# Patient Record
Sex: Female | Born: 1960 | Race: Black or African American | Hispanic: No
Health system: Southern US, Community
[De-identification: ages and names within clinical notes are randomized; demographics above are authoritative.]

## PROBLEM LIST (undated history)

## (undated) DIAGNOSIS — I10 Essential (primary) hypertension: Secondary | ICD-10-CM

## (undated) DIAGNOSIS — E785 Hyperlipidemia, unspecified: Secondary | ICD-10-CM

## (undated) DIAGNOSIS — M109 Gout, unspecified: Secondary | ICD-10-CM

## (undated) DIAGNOSIS — F419 Anxiety disorder, unspecified: Secondary | ICD-10-CM

## (undated) DIAGNOSIS — R7303 Prediabetes: Secondary | ICD-10-CM

## (undated) DIAGNOSIS — M1990 Unspecified osteoarthritis, unspecified site: Secondary | ICD-10-CM

## (undated) HISTORY — PX: ABDOMINAL HYSTERECTOMY: SHX81

## (undated) HISTORY — DX: Gout, unspecified: M10.9

## (undated) HISTORY — DX: Unspecified osteoarthritis, unspecified site: M19.90

## (undated) HISTORY — DX: Anxiety disorder, unspecified: F41.9

## (undated) HISTORY — DX: Prediabetes: R73.03

---

## 2012-10-23 ENCOUNTER — Telehealth: Payer: Self-pay

## 2012-10-23 NOTE — Telephone Encounter (Signed)
Pt was referred by Ferdie Ping, PA at [facility] for screening colonoscopy. LMOM to call.

## 2012-11-04 ENCOUNTER — Telehealth: Payer: Self-pay

## 2012-11-04 ENCOUNTER — Other Ambulatory Visit: Payer: Self-pay

## 2012-11-04 DIAGNOSIS — Z1211 Encounter for screening for malignant neoplasm of colon: Secondary | ICD-10-CM

## 2012-11-04 NOTE — Telephone Encounter (Signed)
Gastroenterology Pre-Procedure Form   Request Date: 11/04/2012    Requesting Physician: Ferdie Ping, PA      PATIENT INFORMATION:  Kristin Waters is a 52 y.o., female (DOB=[birth year]).  PROCEDURE: Procedure(s) requested: colonoscopy Procedure Reason: screening for colon cancer  PATIENT REVIEW QUESTIONS: The patient reports the following:   1. Diabetes Melitis: no 2. Joint replacements in the past 12 months: no 3. Major health problems in the past 3 months: no 4. Has an artificial valve or MVP:no 5. Has been advised in past to take antibiotics in advance of a procedure like teeth cleaning: no}    MEDICATIONS & ALLERGIES:    Patient reports the following regarding taking any blood thinners:   Plavix? no Aspirin?no Coumadin?  no  Patient confirms/reports the following medications:  Current Outpatient Prescriptions  Medication Sig Dispense Refill  . amLODipine (NORVASC) 5 MG tablet Take 5 mg by mouth daily.      . cloNIDine (CATAPRES) 0.1 MG tablet Take 0.1 mg by mouth at bedtime.      . cyclobenzaprine (FLEXERIL) 5 MG tablet Take 5 mg by mouth 3 (three) times daily as needed for muscle spasms. Takes only as needed      . hydrochlorothiazide (HYDRODIURIL) 25 MG tablet Take 25 mg by mouth daily.      Marland Kitchen ibuprofen (ADVIL,MOTRIN) 200 MG tablet Take 200 mg by mouth every 6 (six) hours as needed for pain. Pt said she takes 300 mg once every other day      . potassium chloride (K-DUR) 10 MEQ tablet Take 10 mEq by mouth 2 (two) times daily.      . pravastatin (PRAVACHOL) 40 MG tablet Take 40 mg by mouth daily.       No current facility-administered medications for this visit.    Patient confirms/reports the following allergies:  No Known Allergies  Patient is appropriate to schedule for requested procedure(s): yes  AUTHORIZATION INFORMATION Primary Insurance:   ID #:  Group #:  Pre-Cert / Auth required Pre-Cert / Auth #:   Secondary Insurance:   ID #:   Group #:  Pre-Cert /  Auth required Pre-Cert / Auth #:  No orders of the defined types were placed in this encounter.    SCHEDULE INFORMATION: Procedure has been scheduled as follows:  Date: 11/27/2012 Time: 9:45 AM  Location: [facility] Short Stay  This Gastroenterology Pre-Precedure Form is being routed to the following provider(s) for review: Jonette Eva, MD

## 2012-11-04 NOTE — Telephone Encounter (Signed)
See separate triage.  

## 2012-11-06 NOTE — Telephone Encounter (Signed)
MOVI PREP SPLIT DOSING, REGULAR BREAKFAST. CLEAR LIQUIDS AFTER 9 AM.   DRINK WATER TO KEEP URINE LIGHT YELLOW.

## 2012-11-09 ENCOUNTER — Encounter (HOSPITAL_COMMUNITY): Payer: Self-pay | Admitting: Pharmacy Technician

## 2012-11-09 MED ORDER — PEG-KCL-NACL-NASULF-NA ASC-C 100 G PO SOLR: 1.0000 | ORAL | Status: DC

## 2012-11-09 NOTE — Telephone Encounter (Signed)
Rx sent to the pharmacy and instructions mailed to pt.  

## 2012-11-26 ENCOUNTER — Telehealth: Payer: Self-pay

## 2012-11-26 NOTE — Telephone Encounter (Signed)
Pt left Vm. When I called she said someone had already talked to her about her instructions and she understands it all now.

## 2012-11-26 NOTE — Telephone Encounter (Signed)
REVIEWED.  

## 2012-11-26 NOTE — Telephone Encounter (Signed)
Pt again called about prep. She had Movie prep and instructions ordered. She got the Lindsay. I called and talked to Greig Castilla, Teacher, early years/pre , at [employer]. He said someone inadvertently gave her the trilyte instead of the movie prep. I told him they needed to correct he. He said, oh, by the way, the Movie Prep will cost her over $100.00 can she just keep the Trilyte prep. I asked if they did that because of the price and he said no, that was an over sight. So I told him that Hope Pigeon would be ok and I would call pt and go over instructions for the different prep. I called and explained to pt what happened. Then told her to take 2 Ducolax tablets now and start the prep. Went over the instructions and asked her to take 2 more Ducolax at 8:00 pm. She expressed understanding about the prep and she is aware NPO after midnight and give herself a fleet enema one hour before she goes to the hospital tomorrow morning.

## 2012-11-27 ENCOUNTER — Encounter (HOSPITAL_COMMUNITY)
Admission: RE | Disposition: A | Discharge status: Home / Self Care | Payer: Self-pay | Source: Ambulatory Visit | Attending: Gastroenterology

## 2012-11-27 ENCOUNTER — Ambulatory Visit (HOSPITAL_COMMUNITY)
Admission: RE | Admit: 2012-11-27 | Discharge: 2012-11-27 | Disposition: A | Discharge status: Home / Self Care | Payer: BC Managed Care – PPO | Source: Ambulatory Visit | Attending: Gastroenterology | Admitting: Gastroenterology

## 2012-11-27 ENCOUNTER — Encounter (HOSPITAL_COMMUNITY): Payer: Self-pay | Admitting: *Deleted

## 2012-11-27 DIAGNOSIS — D126 Benign neoplasm of colon, unspecified: Secondary | ICD-10-CM

## 2012-11-27 DIAGNOSIS — K648 Other hemorrhoids: Secondary | ICD-10-CM

## 2012-11-27 DIAGNOSIS — I10 Essential (primary) hypertension: Secondary | ICD-10-CM | POA: Insufficient documentation

## 2012-11-27 DIAGNOSIS — Z1211 Encounter for screening for malignant neoplasm of colon: Secondary | ICD-10-CM

## 2012-11-27 DIAGNOSIS — K5730 Diverticulosis of large intestine without perforation or abscess without bleeding: Secondary | ICD-10-CM | POA: Insufficient documentation

## 2012-11-27 HISTORY — PX: COLONOSCOPY: SHX5424

## 2012-11-27 HISTORY — DX: Hyperlipidemia, unspecified: E78.5

## 2012-11-27 HISTORY — DX: Essential (primary) hypertension: I10

## 2012-11-27 SURGERY — COLONOSCOPY
Anesthesia: Moderate Sedation

## 2012-11-27 MED ORDER — MIDAZOLAM HCL 5 MG/5ML IJ SOLN
INTRAMUSCULAR | Status: DC | PRN
  Administered 2012-11-27: 1 mg via INTRAVENOUS
  Administered 2012-11-27 (×2): 2 mg via INTRAVENOUS

## 2012-11-27 MED ORDER — MEPERIDINE HCL 100 MG/ML IJ SOLN
INTRAMUSCULAR | Status: DC | PRN
  Administered 2012-11-27 (×2): 25 mg via INTRAVENOUS

## 2012-11-27 MED ORDER — MIDAZOLAM HCL 5 MG/5ML IJ SOLN
INTRAMUSCULAR | Status: AC
  Filled 2012-11-27: qty 10

## 2012-11-27 MED ORDER — MEPERIDINE HCL 100 MG/ML IJ SOLN
INTRAMUSCULAR | Status: AC
  Filled 2012-11-27: qty 1

## 2012-11-27 MED ORDER — STERILE WATER FOR IRRIGATION IR SOLN
Status: DC | PRN
  Administered 2012-11-27: 10:00:00

## 2012-11-27 MED ORDER — SODIUM CHLORIDE 0.9 % IV SOLN
INTRAVENOUS | Status: DC
  Administered 2012-11-27: 09:00:00 via INTRAVENOUS

## 2012-11-27 NOTE — Op Note (Signed)
[facility] [address] [place], [address]   COLONOSCOPY PROCEDURE REPORT  PATIENT: Kristin, Waters  MR#: [id number] BIRTHDATE: 03/12/1961 , 52  yrs. old GENDER: Female ENDOSCOPIST: Jonette Eva, MD REFERRED XB:[id number] Merilynn Finland, PA-C PROCEDURE DATE:  11/27/2012 PROCEDURE:   Colonoscopy with cold biopsy polypectomy INDICATIONS:Average risk patient for colon cancer. MEDICATIONS: Demerol 50 mg IV and Versed 5 mg IV  DESCRIPTION OF PROCEDURE:    Physical exam was performed.  Informed consent was obtained from the patient after explaining the benefits, risks, and alternatives to procedure.  The patient was connected to monitor and placed in left lateral position. Continuous oxygen was provided by nasal cannula and IV medicine administered through an indwelling cannula.  After administration of sedation and rectal exam, the patients rectum was intubated and the EC-[id number] ([id number])  colonoscope was advanced under direct visualization to the ileum.  The scope was removed slowly by carefully examining the color, texture, anatomy, and integrity mucosa on the way out.  The patient was recovered in endoscopy and discharged home in satisfactory condition.     COLON FINDINGS: A sessile polyp measuring 3 mm in size was found in the transverse colon.  A polypectomy was performed with cold forceps.  , Mild diverticulosis was noted in the sigmoid colon.  , The colon mucosa was otherwise normal.  , Small internal hemorrhoids were found.  , and The mucosa appeared normal in the terminal ileum.  PREP QUALITY: good.   CECAL W/D TIME: 9 minutes     COMPLICATIONS: None  ENDOSCOPIC IMPRESSION: 1.   ONE POLYP REMOVED 2.   Mild diverticulosis in the sigmoid colon 3.   Small internal hemorrhoids   RECOMMENDATIONS: CONTINUE YOUR WEIGHT LOSS EFFORTS. FOLLOW A HIGH FIBER/LOW FAT DIET.  AVOID ITEMS THAT CAUSE BLOATING.  BIOPSY RESULTS SHOULD BE BACK IN 7 DAYS. Next  colonoscopy in 10 years.       _______________________________ Rosalie DoctorJonette Eva, MD 11/27/2012 4:11 PM     PATIENT NAME:  Kristin, Waters MR#: [id number]

## 2012-11-27 NOTE — H&P (Signed)
  Primary Care Physician:  Pcp Not In System Primary Gastroenterologist:  Dr. Darrick Penna  Pre-Procedure History & Physical: HPI:  Kristin Waters is a 52 y.o. female here for COLON CANCER SCREENING.  Past Medical History  Diagnosis Date  . Hypertension   . Hyperlipidemia     Past Surgical History  Procedure Laterality Date  . Abdominal hysterectomy      Prior to Admission medications   Medication Sig Start Date End Date Taking? Authorizing Provider  amLODipine (NORVASC) 5 MG tablet Take 5 mg by mouth daily.   Yes Historical Provider, MD  cloNIDine (CATAPRES) 0.1 MG tablet Take 0.1 mg by mouth at bedtime.   Yes Historical Provider, MD  cyclobenzaprine (FLEXERIL) 5 MG tablet Take 5 mg by mouth 3 (three) times daily as needed for muscle spasms. Takes only as needed   Yes Historical Provider, MD  hydrochlorothiazide (HYDRODIURIL) 25 MG tablet Take 25 mg by mouth daily.   Yes Historical Provider, MD  naproxen (NAPROSYN) 500 MG tablet Take 500 mg by mouth daily as needed (for pain).   Yes Historical Provider, MD  potassium chloride (K-DUR) 10 MEQ tablet Take 10 mEq by mouth every other day.    Yes Historical Provider, MD  pravastatin (PRAVACHOL) 40 MG tablet Take 40 mg by mouth daily.   Yes Historical Provider, MD    Allergies as of 11/04/2012  . (No Known Allergies)    History reviewed. No pertinent family history.  History   Social History  . Marital Status: Divorced    Spouse Name: N/A    Number of Children: N/A  . Years of Education: N/A   Occupational History  . Not on file.   Social History Main Topics  . Smoking status: Not on file  . Smokeless tobacco: Not on file  . Alcohol Use: Not on file  . Drug Use: Not on file  . Sexually Active: Not on file   Other Topics Concern  . Not on file   Social History Narrative  . No narrative on file    Review of Systems: See HPI, otherwise negative ROS   Physical Exam: BP 133/70  Pulse 72  Temp(Src) 97.8 F (36.6 C)  (Oral)  Resp 20  Ht 5\' 1"  (1.549 m)  Wt 180 lb (81.647 kg)  BMI 34.03 kg/m2  SpO2 98% General:   Alert,  pleasant and cooperative in NAD Head:  Normocephalic and atraumatic. Neck:  Supple; Lungs:  Clear throughout to auscultation.    Heart:  Regular rate and rhythm. Abdomen:  Soft, nontender and nondistended. Normal bowel sounds, without guarding, and without rebound.   Neurologic:  Alert and  oriented x4;  grossly normal neurologically.  Impression/Plan:     SCREENING  Plan:  1. TCS TODAY

## 2012-12-01 ENCOUNTER — Encounter (HOSPITAL_COMMUNITY): Payer: Self-pay | Admitting: Gastroenterology

## 2012-12-01 ENCOUNTER — Telehealth: Payer: Self-pay | Admitting: Gastroenterology

## 2012-12-01 NOTE — Telephone Encounter (Signed)
Pt is aware of results  Darl Pikes  NIC for 10 yr TCS

## 2012-12-01 NOTE — Telephone Encounter (Signed)
Reminder in epic °

## 2012-12-01 NOTE — Telephone Encounter (Signed)
Cc PCP 

## 2012-12-01 NOTE — Telephone Encounter (Signed)
Please call pt. She had A HYPERPLASTIC POLYP removed from her colon.    CONTINUE YOUR WEIGHT LOSS EFFORTS.   FOLLOW A HIGH FIBER/LOW FAT DIET. AVOID ITEMS THAT CAUSE BLOATING.  Next colonoscopy in 10 years.

## 2017-05-15 ENCOUNTER — Other Ambulatory Visit: Payer: Self-pay | Admitting: Family

## 2017-05-15 DIAGNOSIS — Z1239 Encounter for other screening for malignant neoplasm of breast: Secondary | ICD-10-CM

## 2017-06-30 ENCOUNTER — Ambulatory Visit
Admission: RE | Admit: 2017-06-30 | Discharge: 2017-06-30 | Disposition: A | Discharge status: Home / Self Care | Payer: BC Managed Care – PPO | Source: Ambulatory Visit | Attending: Family | Admitting: Family

## 2017-06-30 DIAGNOSIS — Z1231 Encounter for screening mammogram for malignant neoplasm of breast: Secondary | ICD-10-CM | POA: Insufficient documentation

## 2017-06-30 DIAGNOSIS — R928 Other abnormal and inconclusive findings on diagnostic imaging of breast: Secondary | ICD-10-CM | POA: Insufficient documentation

## 2017-06-30 DIAGNOSIS — Z1239 Encounter for other screening for malignant neoplasm of breast: Secondary | ICD-10-CM

## 2017-07-02 ENCOUNTER — Other Ambulatory Visit: Payer: Self-pay | Admitting: Family

## 2017-07-02 DIAGNOSIS — N6320 Unspecified lump in the left breast, unspecified quadrant: Secondary | ICD-10-CM

## 2017-07-02 DIAGNOSIS — R928 Other abnormal and inconclusive findings on diagnostic imaging of breast: Secondary | ICD-10-CM

## 2017-07-10 ENCOUNTER — Ambulatory Visit: Payer: BC Managed Care – PPO

## 2017-07-10 ENCOUNTER — Other Ambulatory Visit: Payer: BC Managed Care – PPO

## 2017-07-18 ENCOUNTER — Ambulatory Visit
Admission: RE | Admit: 2017-07-18 | Discharge: 2017-07-18 | Disposition: A | Discharge status: Home / Self Care | Payer: BC Managed Care – PPO | Source: Ambulatory Visit | Attending: Family | Admitting: Family

## 2017-07-18 DIAGNOSIS — R928 Other abnormal and inconclusive findings on diagnostic imaging of breast: Secondary | ICD-10-CM

## 2017-07-18 DIAGNOSIS — N6320 Unspecified lump in the left breast, unspecified quadrant: Secondary | ICD-10-CM

## 2017-07-18 DIAGNOSIS — N6321 Unspecified lump in the left breast, upper outer quadrant: Secondary | ICD-10-CM | POA: Diagnosis not present

## 2017-11-19 ENCOUNTER — Other Ambulatory Visit: Payer: Self-pay | Admitting: Family

## 2017-11-19 DIAGNOSIS — N6320 Unspecified lump in the left breast, unspecified quadrant: Secondary | ICD-10-CM

## 2017-12-05 ENCOUNTER — Other Ambulatory Visit: Payer: Self-pay | Admitting: Orthopedic Surgery

## 2017-12-05 DIAGNOSIS — M25551 Pain in right hip: Principal | ICD-10-CM

## 2017-12-05 DIAGNOSIS — G8929 Other chronic pain: Secondary | ICD-10-CM

## 2017-12-05 DIAGNOSIS — M25552 Pain in left hip: Principal | ICD-10-CM

## 2017-12-18 ENCOUNTER — Ambulatory Visit
Admission: RE | Admit: 2017-12-18 | Discharge: 2017-12-18 | Disposition: A | Discharge status: Home / Self Care | Payer: BC Managed Care – PPO | Source: Ambulatory Visit | Attending: Orthopedic Surgery | Admitting: Orthopedic Surgery

## 2017-12-18 DIAGNOSIS — G8929 Other chronic pain: Secondary | ICD-10-CM | POA: Diagnosis not present

## 2017-12-18 DIAGNOSIS — M160 Bilateral primary osteoarthritis of hip: Secondary | ICD-10-CM | POA: Diagnosis not present

## 2017-12-18 DIAGNOSIS — M25552 Pain in left hip: Secondary | ICD-10-CM | POA: Insufficient documentation

## 2017-12-18 DIAGNOSIS — M25551 Pain in right hip: Secondary | ICD-10-CM | POA: Insufficient documentation

## 2017-12-18 DIAGNOSIS — M769 Unspecified enthesopathy, lower limb, excluding foot: Secondary | ICD-10-CM | POA: Diagnosis not present

## 2017-12-18 DIAGNOSIS — M25452 Effusion, left hip: Secondary | ICD-10-CM | POA: Diagnosis not present

## 2018-02-11 ENCOUNTER — Ambulatory Visit
Admission: RE | Admit: 2018-02-11 | Discharge: 2018-02-11 | Disposition: A | Discharge status: Home / Self Care | Payer: BC Managed Care – PPO | Source: Ambulatory Visit | Attending: Oncology | Admitting: Oncology

## 2018-02-11 ENCOUNTER — Encounter (INDEPENDENT_AMBULATORY_CARE_PROVIDER_SITE_OTHER): Payer: Self-pay

## 2018-02-11 ENCOUNTER — Ambulatory Visit: Payer: BC Managed Care – PPO | Attending: Oncology

## 2018-02-11 VITALS — BP 133/81 | HR 77 | Temp 98.2°F | Ht 63.0 in | Wt 185.0 lb

## 2018-02-11 DIAGNOSIS — N63 Unspecified lump in unspecified breast: Secondary | ICD-10-CM

## 2018-02-11 NOTE — Progress Notes (Signed)
  Subjective:     Patient ID: Kristin Waters, female   DOB: 1961/02/18, 57 y.o.   MRN: [id number]  HPI   Review of Systems     Objective:   Physical Exam  Pulmonary/Chest: Right breast exhibits no inverted nipple, no mass, no nipple discharge, no skin change and no tenderness. Left breast exhibits no inverted nipple, no mass, no nipple discharge, no skin change and no tenderness. Breasts are asymmetrical.  Left breast larger than right;       Assessment:     57 year old patient presents for [place] clinic visit.  Patient had a Birads 3 mammogram in March 2019 recommending 6 month follow-up ultrasound, left breast mass.   Patient screened, and meets BCCCP eligibility.  Patient no longer has insurance. Patient does not have insurance, Medicare or Medicaid.  Handout given on Affordable Care Act.Instructed patient on breast self awareness using teach back methodClinical breast exam unremarkable.    Plan:     Sent for left breast ultrasound. Will follow per BCCCP guidelines.

## 2018-02-12 ENCOUNTER — Other Ambulatory Visit: Payer: Self-pay

## 2018-02-12 DIAGNOSIS — N63 Unspecified lump in unspecified breast: Secondary | ICD-10-CM

## 2018-02-12 NOTE — Progress Notes (Signed)
Radiologist recommends 6 month follow-up and annual diagnostic mammogram, and left breast ultrasound.  Patient scheduled for mammogram, and ultrasound on 08/19/18 at 10:00 in the [facility]  She is not due for BCCCP screening until 01/2019.  Mailed appointment info to patient.  Copy to [facility].

## 2018-08-19 ENCOUNTER — Other Ambulatory Visit: Payer: BC Managed Care – PPO

## 2018-08-19 ENCOUNTER — Ambulatory Visit: Payer: BC Managed Care – PPO

## 2018-10-21 ENCOUNTER — Other Ambulatory Visit: Payer: BC Managed Care – PPO

## 2019-01-18 ENCOUNTER — Ambulatory Visit
Admission: RE | Admit: 2019-01-18 | Discharge: 2019-01-18 | Disposition: A | Discharge status: Home / Self Care | Payer: BC Managed Care – PPO | Source: Ambulatory Visit | Attending: Oncology | Admitting: Oncology

## 2019-01-18 ENCOUNTER — Ambulatory Visit
Admission: RE | Admit: 2019-01-18 | Discharge: 2019-01-18 | Disposition: A | Discharge status: Home / Self Care | Payer: BC Managed Care – PPO | Source: Ambulatory Visit | Attending: Family | Admitting: Family

## 2019-01-18 DIAGNOSIS — N63 Unspecified lump in unspecified breast: Secondary | ICD-10-CM

## 2019-01-18 DIAGNOSIS — N6320 Unspecified lump in the left breast, unspecified quadrant: Secondary | ICD-10-CM

## 2019-01-21 NOTE — Progress Notes (Signed)
Patient to return in one year for BCCCP screening/mammogram for 2 year stability related to [facility] mammogram results.  Mailed appointment remnder.

## 2019-05-26 IMAGING — MG MM DIGITAL SCREENING BILAT W/ CAD
6 series · 6 of 6 positions shown · non-contrast
Comparison: None.

CLINICAL DATA: Screening.

EXAM:
DIGITAL SCREENING BILATERAL MAMMOGRAM WITH CAD

[L MLO]
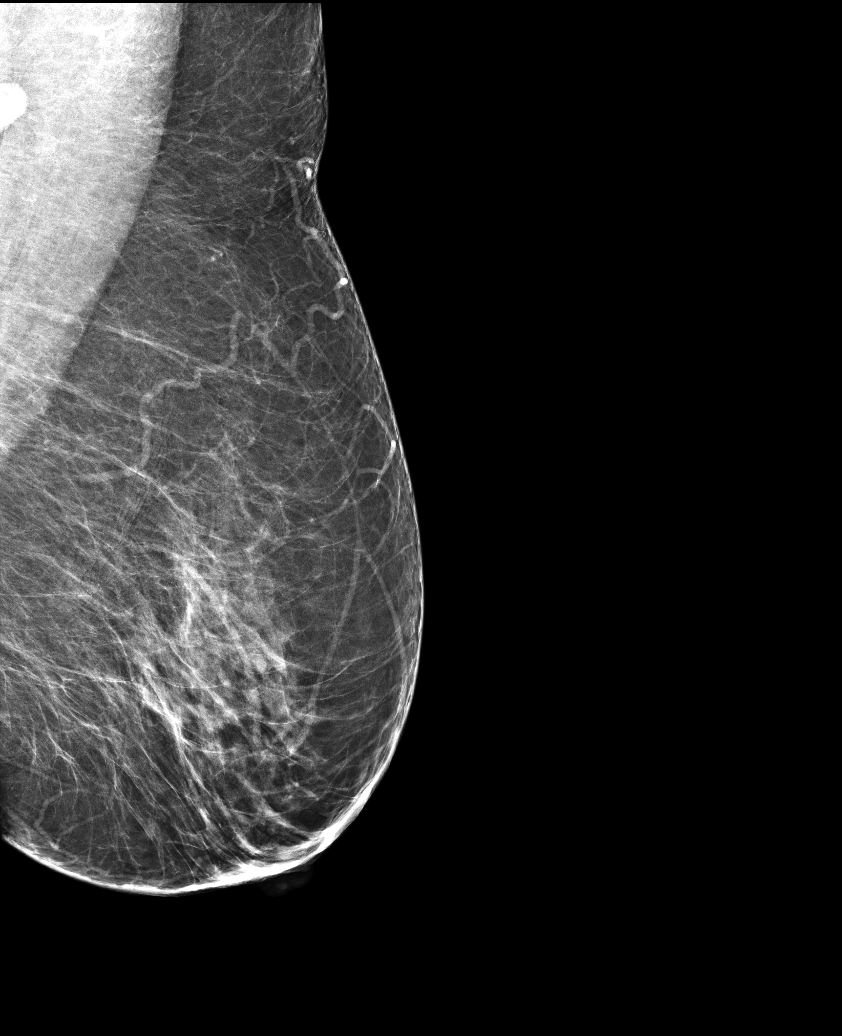

[L CC (1 of 2)]
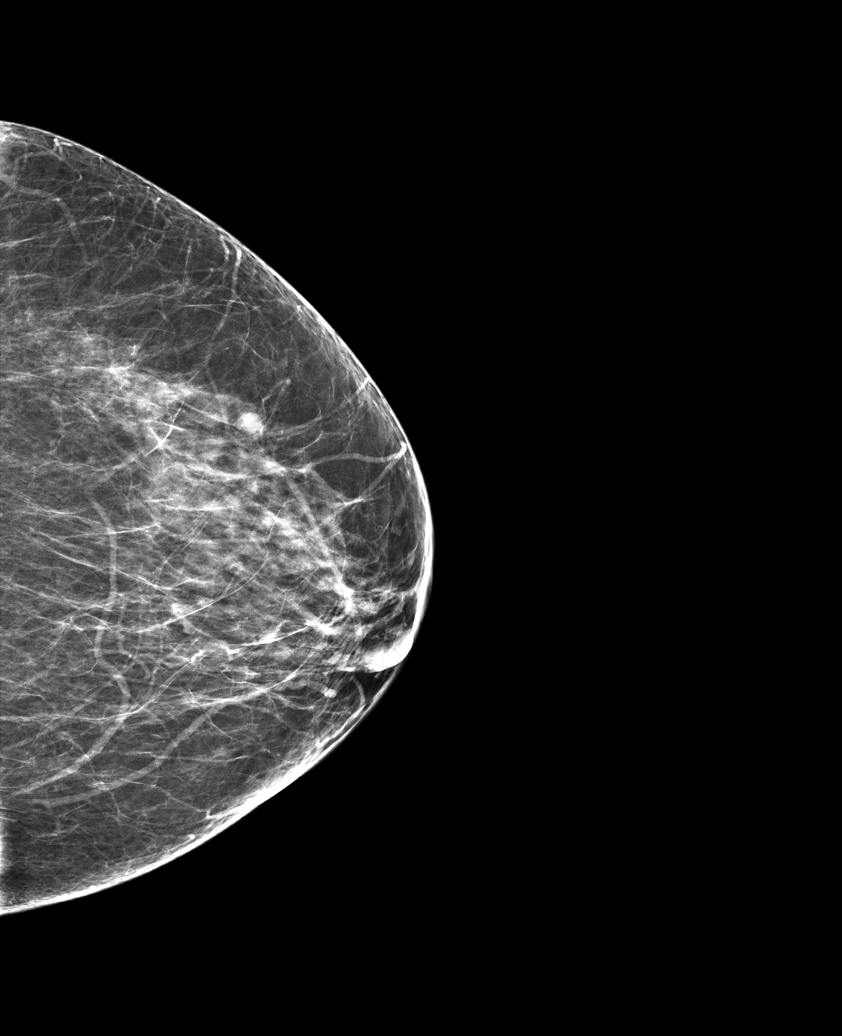

[R XCCM]
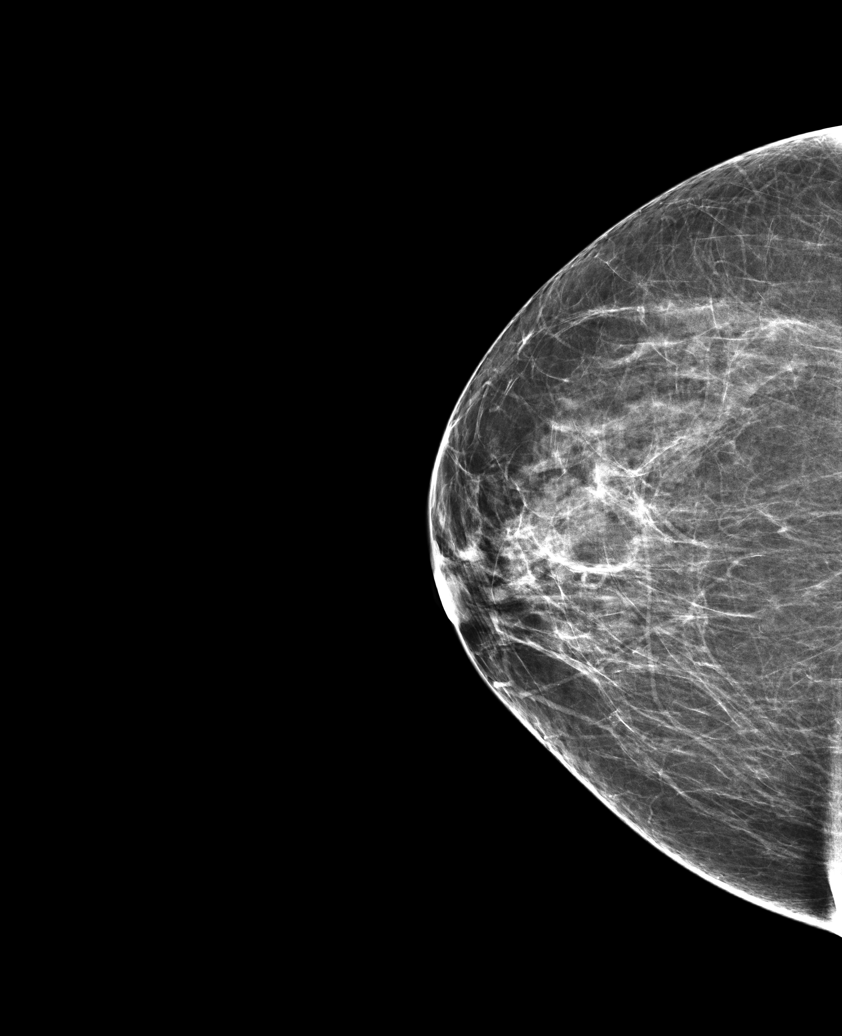

[R CC]
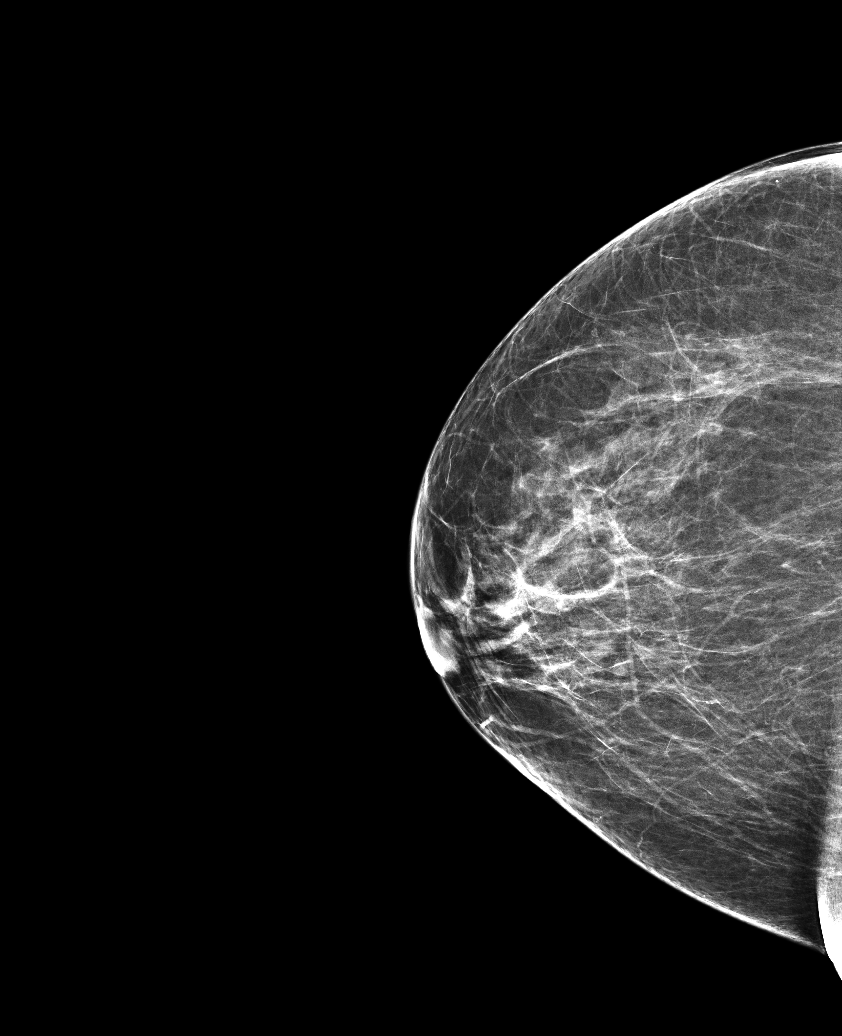

[R MLO]
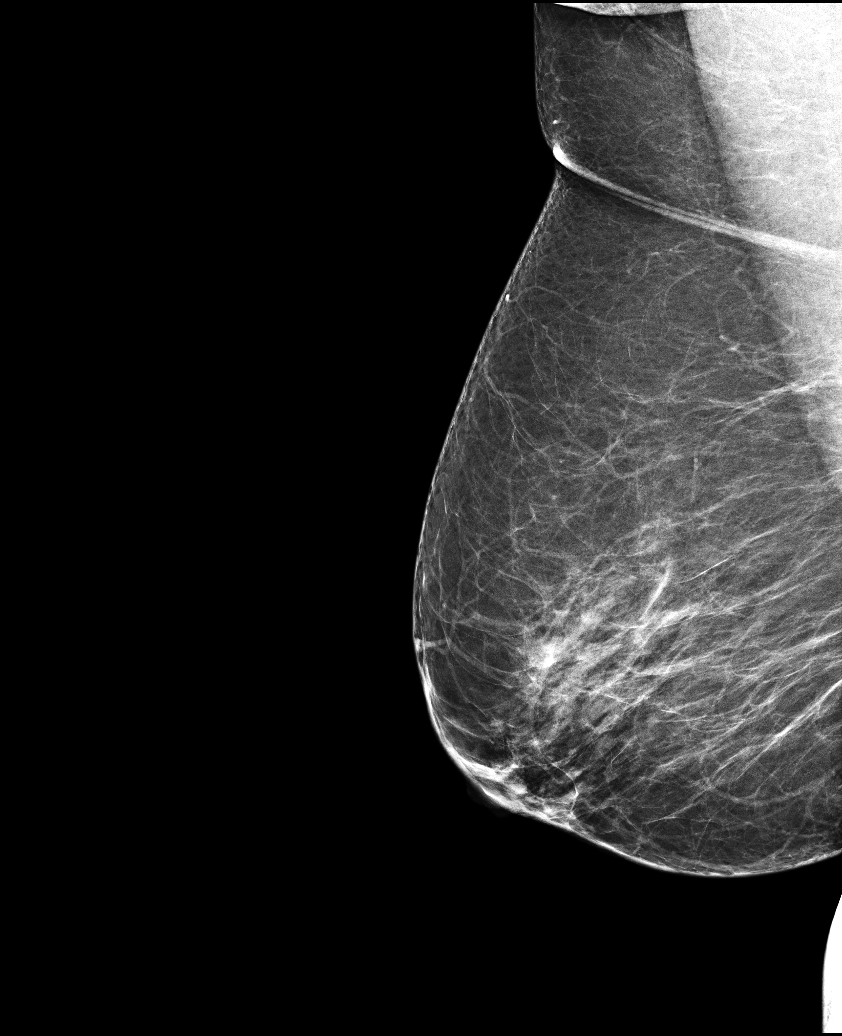

[L CC (2 of 2)]
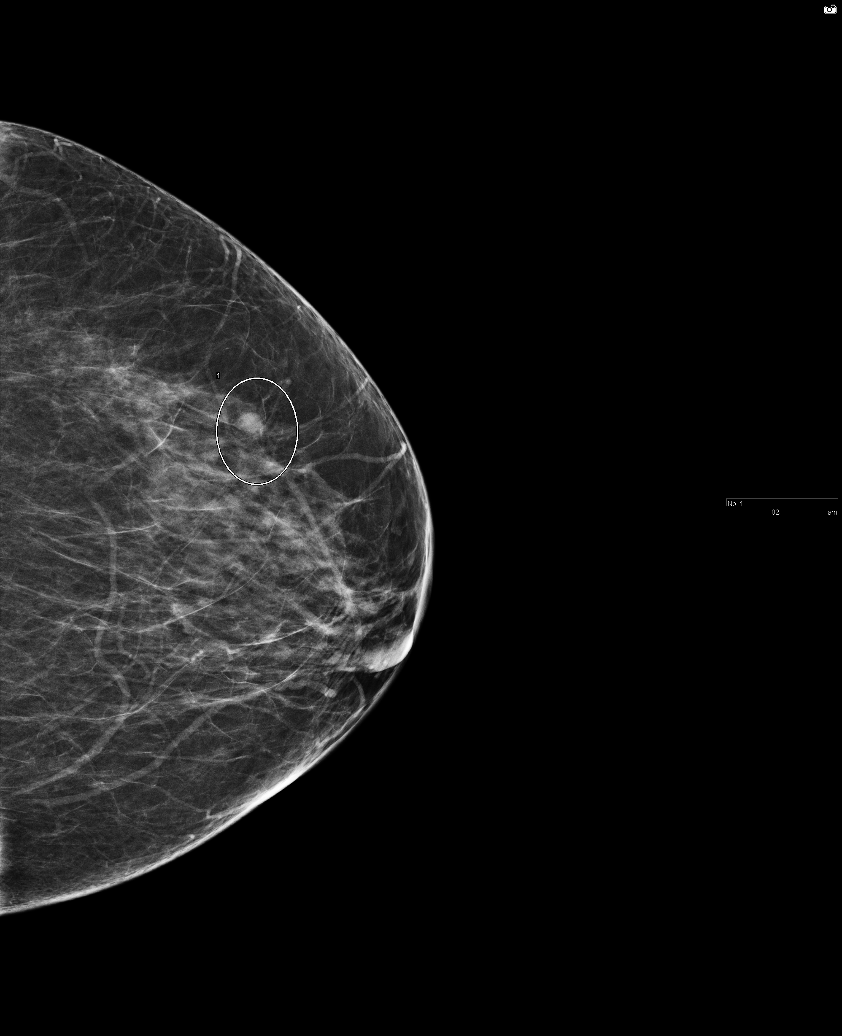

[6 of 6 positions shown; findings below may reference images not displayed]

ACR Breast Density Category b: There are scattered areas of
fibroglandular density.
FINDINGS: In the left breast, a possible mass warrants further evaluation.
This possible mass is seen within the outer left breast, at middle
depth, seen on the CC view only.

In the right breast, no findings suspicious for malignancy.

Images were processed with CAD.
IMPRESSION: Further evaluation is suggested for possible mass in the left
breast.

RECOMMENDATION:
Diagnostic mammogram and possibly ultrasound of the left breast.
(Code:AI-[id number])

The patient will be contacted regarding the findings, and additional
imaging will be scheduled.

BI-RADS CATEGORY  0: Incomplete. Need additional imaging evaluation
and/or prior mammograms for comparison.

## 2019-11-25 ENCOUNTER — Other Ambulatory Visit: Payer: Self-pay | Admitting: Family

## 2019-11-25 DIAGNOSIS — R928 Other abnormal and inconclusive findings on diagnostic imaging of breast: Secondary | ICD-10-CM

## 2020-01-19 ENCOUNTER — Telehealth: Payer: Self-pay | Admitting: *Deleted

## 2020-01-19 ENCOUNTER — Ambulatory Visit: Payer: BC Managed Care – PPO | Attending: Oncology

## 2020-08-09 ENCOUNTER — Ambulatory Visit: Payer: BC Managed Care – PPO

## 2020-09-26 DIAGNOSIS — M160 Bilateral primary osteoarthritis of hip: Secondary | ICD-10-CM | POA: Insufficient documentation

## 2020-09-26 DIAGNOSIS — I10 Essential (primary) hypertension: Secondary | ICD-10-CM | POA: Insufficient documentation

## 2021-09-28 DIAGNOSIS — R7303 Prediabetes: Secondary | ICD-10-CM | POA: Insufficient documentation

## 2021-10-02 ENCOUNTER — Other Ambulatory Visit: Payer: Self-pay | Admitting: Family Medicine

## 2021-10-02 DIAGNOSIS — N63 Unspecified lump in unspecified breast: Secondary | ICD-10-CM

## 2021-10-22 ENCOUNTER — Other Ambulatory Visit: Payer: BC Managed Care – PPO

## 2021-10-30 ENCOUNTER — Other Ambulatory Visit: Payer: BC Managed Care – PPO

## 2021-10-30 ENCOUNTER — Inpatient Hospital Stay: Admission: RE | Admit: 2021-10-30 | Payer: BC Managed Care – PPO | Source: Ambulatory Visit

## 2021-11-12 ENCOUNTER — Ambulatory Visit
Admission: RE | Admit: 2021-11-12 | Discharge: 2021-11-12 | Disposition: A | Discharge status: Home / Self Care | Payer: BC Managed Care – PPO | Source: Ambulatory Visit | Attending: Family Medicine | Admitting: Family Medicine

## 2021-11-12 DIAGNOSIS — N63 Unspecified lump in unspecified breast: Secondary | ICD-10-CM | POA: Diagnosis present

## 2022-10-10 ENCOUNTER — Encounter: Payer: Self-pay | Admitting: *Deleted

## 2023-09-19 ENCOUNTER — Ambulatory Visit: Payer: Self-pay

## 2023-09-19 DIAGNOSIS — K635 Polyp of colon: Secondary | ICD-10-CM | POA: Diagnosis not present

## 2023-09-19 DIAGNOSIS — Z1211 Encounter for screening for malignant neoplasm of colon: Secondary | ICD-10-CM | POA: Diagnosis present

## 2023-09-19 DIAGNOSIS — K5730 Diverticulosis of large intestine without perforation or abscess without bleeding: Secondary | ICD-10-CM | POA: Diagnosis not present

## 2023-09-19 DIAGNOSIS — K640 First degree hemorrhoids: Secondary | ICD-10-CM | POA: Diagnosis not present

## 2023-10-17 ENCOUNTER — Other Ambulatory Visit: Payer: Self-pay | Admitting: Family Medicine

## 2023-10-17 DIAGNOSIS — E876 Hypokalemia: Secondary | ICD-10-CM | POA: Insufficient documentation

## 2023-10-17 DIAGNOSIS — Z780 Asymptomatic menopausal state: Secondary | ICD-10-CM | POA: Insufficient documentation

## 2023-10-17 DIAGNOSIS — G8929 Other chronic pain: Secondary | ICD-10-CM | POA: Insufficient documentation

## 2023-10-17 DIAGNOSIS — M5416 Radiculopathy, lumbar region: Secondary | ICD-10-CM

## 2023-10-22 ENCOUNTER — Encounter: Payer: Self-pay | Admitting: Family Medicine

## 2023-11-03 ENCOUNTER — Ambulatory Visit
Admission: RE | Admit: 2023-11-03 | Discharge: 2023-11-03 | Disposition: A | Discharge status: Home / Self Care | Payer: Self-pay | Source: Ambulatory Visit | Attending: Family Medicine | Admitting: Family Medicine

## 2023-11-03 DIAGNOSIS — M5416 Radiculopathy, lumbar region: Secondary | ICD-10-CM

## 2024-03-08 ENCOUNTER — Other Ambulatory Visit: Payer: Self-pay | Admitting: Family Medicine

## 2024-03-08 DIAGNOSIS — M25551 Pain in right hip: Secondary | ICD-10-CM

## 2024-03-13 ENCOUNTER — Ambulatory Visit
Admission: RE | Admit: 2024-03-13 | Discharge: 2024-03-13 | Disposition: A | Discharge status: Home / Self Care | Source: Ambulatory Visit | Attending: Family Medicine | Admitting: Family Medicine

## 2024-03-13 ENCOUNTER — Other Ambulatory Visit

## 2024-03-13 DIAGNOSIS — M25551 Pain in right hip: Secondary | ICD-10-CM

## 2024-03-19 ENCOUNTER — Other Ambulatory Visit: Payer: Self-pay | Admitting: Family Medicine

## 2024-03-19 ENCOUNTER — Inpatient Hospital Stay
Admission: RE | Admit: 2024-03-19 | Discharge: 2024-03-19 | Disposition: A | Discharge status: Home / Self Care | Payer: Self-pay | Source: Ambulatory Visit | Attending: Orthopedic Surgery | Admitting: Orthopedic Surgery

## 2024-03-19 DIAGNOSIS — Z049 Encounter for examination and observation for unspecified reason: Secondary | ICD-10-CM

## 2024-03-22 ENCOUNTER — Encounter: Payer: Self-pay | Admitting: Orthopedic Surgery

## 2024-03-22 DIAGNOSIS — M109 Gout, unspecified: Secondary | ICD-10-CM | POA: Insufficient documentation

## 2024-03-22 DIAGNOSIS — E782 Mixed hyperlipidemia: Secondary | ICD-10-CM | POA: Insufficient documentation

## 2024-03-22 DIAGNOSIS — M1610 Unilateral primary osteoarthritis, unspecified hip: Secondary | ICD-10-CM | POA: Insufficient documentation

## 2024-03-22 DIAGNOSIS — Z90710 Acquired absence of both cervix and uterus: Secondary | ICD-10-CM | POA: Insufficient documentation

## 2024-03-22 DIAGNOSIS — F411 Generalized anxiety disorder: Secondary | ICD-10-CM | POA: Insufficient documentation

## 2024-03-22 NOTE — Progress Notes (Unsigned)
 Referring Physician:  Carlisle Benton CROME, FNP [address] [place],  [place] [address]  Primary Physician:  Dot Boot, PA-C  History of Present Illness: 03/24/2024 [name] has a history of HTN, gout, GAD, prediabetes, mixed hyperlipidemia, obesity.   She has been seeing PMR at [facility]. She was written for light duty x 12 weeks at her last visit.   Right hip MRI showed right hip OA, labral tear, mild bilateral GTB, and mild bilateral hamstring tendinosis worse on left.   She has constant LBP with constant bilateral posterior leg pain into her calf. LBP < leg pain, right leg pain > left leg pain. She has numbness, tingling, and weakness in both legs. Has numbness in both feet as well. She has some relief with medications. Pain is worse with standing and walking. She has some relief with grocery cart. Also worse with riding in the car.   She starts PT tomorrow.   Tobacco use: Does not smoke.   Bowel/Bladder Dysfunction: none  Conservative measures:  Physical therapy:  will start PT with [facility] in [place] on 03/24/2024 per their office Multimodal medical therapy including regular antiinflammatories:  Meloxicam, Tylenol, Gabapentin, Prednisone, Tramadol, Flexeril Injections:   02/13/2024: Right hip joint injection under fluoroscopy (questionable 2 days of relief) 12/16/2023: Bilateral S1 transforaminal ESI (no relief) 09/16/2023: Right hip joint injection under fluoroscopy (65% relief)   Past Surgery: no spine surgery  Kristin Waters has no symptoms of cervical myelopathy.  The symptoms are causing a significant impact on the patient's life.   Review of Systems:  A 10 point review of systems is negative, except for the pertinent positives and negatives detailed in the HPI.  Past Medical History: Past Medical History:  Diagnosis Date   Anxiety    Gout    Hyperlipidemia    Hypertension    OA (osteoarthritis)    Pre-diabetes     Past Surgical  History: Past Surgical History:  Procedure Laterality Date   ABDOMINAL HYSTERECTOMY     COLONOSCOPY N/A 11/27/2012   Procedure: COLONOSCOPY;  Surgeon: Margo CROME Haddock, MD;  Location: [facility];  Service: Endoscopy;  Laterality: N/A;  9:45     Allergies: Allergies as of 03/24/2024   (No Known Allergies)    Medications: Outpatient Encounter Medications as of 03/24/2024  Medication Sig   amLODipine (NORVASC) 5 MG tablet Take 5 mg by mouth daily.   buPROPion (WELLBUTRIN XL) 300 MG 24 hr tablet Take 1 tablet by mouth every morning.   cetirizine (ZYRTEC) 10 MG tablet Take 10 mg by mouth daily.   Cholecalciferol (D 1000) 25 MCG (1000 UT) capsule Take 1,000 Units by mouth daily.   cloNIDine (CATAPRES) 0.1 MG tablet Take 0.1 mg by mouth at bedtime.   colchicine 0.6 MG tablet Take 1 daily as need for gout until symptoms are gone.   gabapentin (NEURONTIN) 100 MG capsule Take 100 mg by mouth 3 (three) times daily.   hydrochlorothiazide (HYDRODIURIL) 25 MG tablet Take 25 mg by mouth daily.   meloxicam (MOBIC) 15 MG tablet Take 15 mg by mouth daily.   naproxen (NAPROSYN) 500 MG tablet Take 500 mg by mouth daily as needed (for pain).   potassium chloride (K-DUR) 10 MEQ tablet Take 10 mEq by mouth every other day.    traMADol (ULTRAM) 50 MG tablet Take 50 mg by mouth.   [DISCONTINUED] cyclobenzaprine (FLEXERIL) 5 MG tablet Take 5 mg by mouth 3 (three) times daily as needed for muscle  spasms. Takes only as needed   [DISCONTINUED] pravastatin (PRAVACHOL) 40 MG tablet Take 40 mg by mouth daily.   [DISCONTINUED] triamcinolone ointment (KENALOG) 0.1 % 1 Application.   No facility-administered encounter medications on file as of 03/24/2024.    Social History:    Family Medical History: Family History  Problem Relation Age of Onset   Breast cancer Maternal Grandmother     Physical Examination: Vitals:   03/24/24 1515  BP: 136/84    General: Patient is well developed, well nourished, calm,  collected, and in no apparent distress. Attention to examination is appropriate.  Respiratory: Patient is breathing without any difficulty.   NEUROLOGICAL:     Awake, alert, oriented to person, place, and time.  Speech is clear and fluent. Fund of knowledge is appropriate.   Cranial Nerves: Pupils equal round and reactive to light.  Facial tone is symmetric.   No abnormal lesions on exposed skin.   Strength: Side Biceps Triceps Deltoid Interossei Grip Wrist Ext. Wrist Flex.  R 5 5 5 5 5 5 5   L 5 5 5 5 5 5 5    Side Iliopsoas Quads Hamstring PF DF EHL  R 5 5 5 5 5 5   L 5 5 5 5 5 5    Reflexes are 2+ and symmetric at the biceps, brachioradialis, patella and achilles.   Hoffman's is absent.  Clonus is not present.   Bilateral upper and lower extremity sensation is intact to light touch.     No pain with IR/ER of both hips.   Gait is normal.     Medical Decision Making  Imaging: MRI lumbar spine dated 11/03/23:  FINDINGS: Segmentation: Standard.   Alignment:  Grade 1 anterolisthesis of L4 on L5.   Vertebrae: Vertebral bodies demonstrate normal signal intensity. No compression fractures.   Conus medullaris and cauda equina: The conus medullaris terminates at the level of L1-L2. The distal spinal cord signal intensity is normal.   Paraspinal and other soft tissues: The visualized abdomen and pelvis show no soft tissue abnormality. The visualized aorta is normal.   Disc levels:   L1-L2: Mild disc bulge. Mild facet arthropathy. Moderate bilateral neuroforaminal stenosis. Mild spinal canal stenosis.   L2-L3: Disc bulge. Moderate bilateral facet arthropathy. Moderate bilateral neuroforaminal stenosis. Moderate spinal canal stenosis.   L3-L4: Disc bulge. Moderate bilateral facet arthropathy. Moderate bilateral neuroforaminal stenosis. Moderate spinal canal stenosis.   L4-L5: Disc bulge. Severe bilateral facet arthropathy. Severe right and moderate left neuroforaminal  stenosis. Severe spinal canal stenosis.   L5-S1: Disc bulge. Severe bilateral facet arthropathy. Severe right and moderate left neuroforaminal stenosis. No spinal canal stenosis.   IMPRESSION: 1. Severe spinal canal stenosis at L4-L5 secondary to disc bulging and facet arthropathy. Severe right and moderate left foraminal stenosis at this level. 2. Moderate canal stenosis at L2-L3 and L3-L4 secondary to disc bulging and facet arthropathy. Moderate foraminal stenoses at these levels. 3. Severe right and moderate left foraminal stenosis at L5-S1 secondary to disc bulging and facet arthropathy.     Electronically Signed   By: Clem Savory M.D.   On: 11/17/2023 13:16    I have personally reviewed the images and agree with the above interpretation.   Xrays of lumbar spine dated 09/03/23:  Slip at L4-L5 with no gross instability noted.   No report for above xrays.   Assessment and Plan: Ms. Somarriba has constant LBP with constant bilateral posterior leg pain into her calf. LBP < leg pain, right leg pain >  left leg pain. She has numbness, tingling, and weakness in both legs. Has numbness in both feet as well.    She has known mild central stenosis L1-L2, moderate central stenosis L2-L4, and slip L4-L5 with severe central stenosis. 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   No current pain with IR/ER of her right hip. Her current pain appears to be more lumbar mediated.   Treatment options discussed with patient and following plan made:   - Agree with PT for lumbar spine. Scheduled to start tomorrow.  - Would hold on further injections as they have not helped.  - Message sent to [facility] regarding her medications.  - If no improvement with PT, recommend she follow up with one of the surgeons to discuss possible surgery options.  - She is aware she would need to 6 weeks of PT prior to any surgical discussion.  - Will message her in 4-5 weeks to check on her  progress with PT and then discuss follow up.   I spent a total of 45 minutes in face-to-face and non-face-to-face activities related to this patient's care today including review of outside records, review of imaging, review of symptoms, physical exam, discussion of differential diagnosis, discussion of treatment options, and documentation.   Thank you for involving me in the care of this patient.   Glade Boys PA-C Dept. of Neurosurgery

## 2024-03-24 ENCOUNTER — Ambulatory Visit: Admitting: Orthopedic Surgery

## 2024-03-24 ENCOUNTER — Encounter: Payer: Self-pay | Admitting: Orthopedic Surgery

## 2024-03-24 VITALS — BP 136/84 | Wt 189.0 lb

## 2024-03-24 DIAGNOSIS — M4726 Other spondylosis with radiculopathy, lumbar region: Secondary | ICD-10-CM

## 2024-03-24 DIAGNOSIS — M48062 Spinal stenosis, lumbar region with neurogenic claudication: Secondary | ICD-10-CM

## 2024-03-24 DIAGNOSIS — M5416 Radiculopathy, lumbar region: Secondary | ICD-10-CM

## 2024-03-24 DIAGNOSIS — M47816 Spondylosis without myelopathy or radiculopathy, lumbar region: Secondary | ICD-10-CM

## 2024-03-24 DIAGNOSIS — M4316 Spondylolisthesis, lumbar region: Secondary | ICD-10-CM

## 2024-03-24 NOTE — Patient Instructions (Signed)
 It was so nice to see you today. Thank you so much for coming in.    You have wear and tear in your lower back with a slip at L4-L5. You have spinal stenosis at multiple levels that is worse at L4-L5.   I think pain in your buttock is from your back and not your hip.   I agree with PT. You would need to do 6 weeks of PT prior to any discussion of surgery options for your back.   I will message you in 4 weeks to check on your progress with PT. If no better, we will schedule you with one of the surgeons.   Please do not hesitate to call if you have any questions or concerns. You can also message me in MyChart.   Glade Boys PA-C [phone]     The physicians and staff at [facility] at [facility] are committed to providing excellent care. You may receive a survey asking for feedback about your experience at our office. We value you your feedback and appreciate you taking the time to to fill it out. The [facility] leadership team is also available to discuss your experience in person, feel free to contact us  [phone].

## 2024-05-10 ENCOUNTER — Telehealth: Payer: Self-pay | Admitting: Orthopedic Surgery

## 2024-05-10 NOTE — Telephone Encounter (Signed)
 Please schedule her a f/u with me in 6 weeks.    Marques-Davis, Silvia M, CMA  Hilma Hastings, PA-C Spoke to patient. She stated she has been to 2 PT visits, and is somewhat better.

## 2024-05-10 NOTE — Telephone Encounter (Signed)
Mailbox full, unable to leave voicemail

## 2024-06-18 NOTE — Progress Notes (Unsigned)
 "  Referring Physician:  Bounvilay, Celena, PA-C No address on file  Primary Physician:  Dot Boot, PA-C  History of Present Illness: [name] has a history of HTN, gout, GAD, prediabetes, mixed hyperlipidemia, obesity.   Last seen by me on 03/24/24 for constant LBP with bilateral leg pain. She has known mild central stenosis L1-L2, moderate central stenosis L2-L4, and slip L4-L5 with severe central stenosis. 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   She was to start PT- ***. Previous injections have not helped.   She is here for follow up.         She has been seeing PMR at [facility]. She was written for light duty x 12 weeks at her last visit.   Right hip MRI showed right hip OA, labral tear, mild bilateral GTB, and mild bilateral hamstring tendinosis worse on left.   She has constant LBP with constant bilateral posterior leg pain into her calf. LBP < leg pain, right leg pain > left leg pain. She has numbness, tingling, and weakness in both legs. Has numbness in both feet as well. She has some relief with medications. Pain is worse with standing and walking. She has some relief with grocery cart. Also worse with riding in the car.   She starts PT tomorrow.   Tobacco use: Does not smoke.   Bowel/Bladder Dysfunction: none  Conservative measures:  Physical therapy:  will start PT with [facility] in [place] on 03/24/2024 per their office*** Multimodal medical therapy including regular antiinflammatories:  Meloxicam, Tylenol, Gabapentin, Prednisone, Tramadol, Flexeril Injections:   02/13/2024: Right hip joint injection under fluoroscopy (questionable 2 days of relief) 12/16/2023: Bilateral S1 transforaminal ESI (no relief) 09/16/2023: Right hip joint injection under fluoroscopy (65% relief)   Past Surgery: no spine surgery  Vester B Seidl has no symptoms of cervical myelopathy.  The symptoms are causing a significant impact on the  patient's life.   Review of Systems:  A 10 point review of systems is negative, except for the pertinent positives and negatives detailed in the HPI.  Past Medical History: Past Medical History:  Diagnosis Date   Anxiety    Gout    Hyperlipidemia    Hypertension    OA (osteoarthritis)    Pre-diabetes     Past Surgical History: Past Surgical History:  Procedure Laterality Date   ABDOMINAL HYSTERECTOMY     COLONOSCOPY N/A 11/27/2012   Procedure: COLONOSCOPY;  Surgeon: Margo LITTIE Haddock, MD;  Location: [facility];  Service: Endoscopy;  Laterality: N/A;  9:45     Allergies: Allergies as of 06/22/2024   (No Known Allergies)    Medications: Outpatient Encounter Medications as of 06/22/2024  Medication Sig   amLODipine (NORVASC) 5 MG tablet Take 5 mg by mouth daily.   buPROPion (WELLBUTRIN XL) 300 MG 24 hr tablet Take 1 tablet by mouth every morning.   cetirizine (ZYRTEC) 10 MG tablet Take 10 mg by mouth daily.   Cholecalciferol (D 1000) 25 MCG (1000 UT) capsule Take 1,000 Units by mouth daily.   cloNIDine (CATAPRES) 0.1 MG tablet Take 0.1 mg by mouth at bedtime.   colchicine 0.6 MG tablet Take 1 daily as need for gout until symptoms are gone.   gabapentin (NEURONTIN) 100 MG capsule Take 100 mg by mouth 3 (three) times daily.   hydrochlorothiazide (HYDRODIURIL) 25 MG tablet Take 25 mg by mouth daily.   meloxicam (MOBIC) 15 MG tablet Take 15 mg by mouth daily.  naproxen (NAPROSYN) 500 MG tablet Take 500 mg by mouth daily as needed (for pain).   potassium chloride (K-DUR) 10 MEQ tablet Take 10 mEq by mouth every other day.    traMADol (ULTRAM) 50 MG tablet Take 50 mg by mouth.   No facility-administered encounter medications on file as of 06/22/2024.    Social History:    Family Medical History: Family History  Problem Relation Age of Onset   Breast cancer Maternal Grandmother     Physical Examination: There were no vitals filed for this visit.    Awake, alert,  oriented to person, place, and time.  Speech is clear and fluent. Fund of knowledge is appropriate.   Cranial Nerves: Pupils equal round and reactive to light.  Facial tone is symmetric.   No abnormal lesions on exposed skin.   Strength: Side Biceps Triceps Deltoid Interossei Grip Wrist Ext. Wrist Flex.  R 5 5 5 5 5 5 5   L 5 5 5 5 5 5 5    Side Iliopsoas Quads Hamstring PF DF EHL  R 5 5 5 5 5 5   L 5 5 5 5 5 5    Reflexes are 2+ and symmetric at the biceps, brachioradialis, patella and achilles.   Hoffman's is absent.  Clonus is not present.   Bilateral upper and lower extremity sensation is intact to light touch.     No pain with IR/ER of both hips.   Gait is normal.     Medical Decision Making  Imaging: none  Assessment and Plan: Ms. Paone has constant LBP with constant bilateral posterior leg pain into her calf. LBP < leg pain, right leg pain > left leg pain. She has numbness, tingling, and weakness in both legs. Has numbness in both feet as well.    She has known mild central stenosis L1-L2, moderate central stenosis L2-L4, and slip L4-L5 with severe central stenosis. 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   No current pain with IR/ER of her right hip. Her current pain appears to be more lumbar mediated.   Treatment options discussed with patient and following plan made:   - Agree with PT for lumbar spine. Scheduled to start tomorrow.  - Would hold on further injections as they have not helped.  - Message sent to [facility] regarding her medications.  - If no improvement with PT, recommend she follow up with one of the surgeons to discuss possible surgery options.  - She is aware she would need to 6 weeks of PT prior to any surgical discussion.  - Will message her in 4-5 weeks to check on her progress with PT and then discuss follow up.   I spent a total of 45 minutes in face-to-face and non-face-to-face activities related to this patient's  care today including review of outside records, review of imaging, review of symptoms, physical exam, discussion of differential diagnosis, discussion of treatment options, and documentation.   Thank you for involving me in the care of this patient.   Glade Boys PA-C Dept. of Neurosurgery  "

## 2024-06-22 ENCOUNTER — Ambulatory Visit: Admitting: Orthopedic Surgery

## 2024-07-06 ENCOUNTER — Ambulatory Visit: Admitting: Orthopedic Surgery
